# Patient Record
Sex: Female | Born: 1980 | Race: White | Marital: Single | Smoking: Never smoker
Health system: Southern US, Community
[De-identification: ages and names within clinical notes are randomized; demographics above are authoritative.]

## PROBLEM LIST (undated history)

## (undated) DIAGNOSIS — F329 Major depressive disorder, single episode, unspecified: Secondary | ICD-10-CM

## (undated) DIAGNOSIS — F32A Depression, unspecified: Secondary | ICD-10-CM

## (undated) DIAGNOSIS — J45909 Unspecified asthma, uncomplicated: Secondary | ICD-10-CM

## (undated) DIAGNOSIS — F419 Anxiety disorder, unspecified: Secondary | ICD-10-CM

## (undated) DIAGNOSIS — I10 Essential (primary) hypertension: Secondary | ICD-10-CM

## (undated) HISTORY — DX: Major depressive disorder, single episode, unspecified: F32.9

## (undated) HISTORY — DX: Unspecified asthma, uncomplicated: J45.909

## (undated) HISTORY — DX: Essential (primary) hypertension: I10

## (undated) HISTORY — DX: Anxiety disorder, unspecified: F41.9

## (undated) HISTORY — PX: CHOLECYSTECTOMY: SHX55

## (undated) HISTORY — DX: Depression, unspecified: F32.A

---

## 2017-08-24 ENCOUNTER — Other Ambulatory Visit: Payer: Self-pay | Admitting: Orthopedic Surgery

## 2017-08-24 DIAGNOSIS — M25522 Pain in left elbow: Secondary | ICD-10-CM

## 2017-09-06 ENCOUNTER — Ambulatory Visit
Admission: RE | Admit: 2017-09-06 | Discharge: 2017-09-06 | Disposition: A | Discharge status: Home / Self Care | Payer: 59 | Source: Ambulatory Visit | Attending: Orthopedic Surgery | Admitting: Orthopedic Surgery

## 2017-09-06 DIAGNOSIS — M25522 Pain in left elbow: Secondary | ICD-10-CM

## 2017-10-27 ENCOUNTER — Encounter: Payer: Self-pay | Admitting: Neurology

## 2017-10-27 ENCOUNTER — Ambulatory Visit: Payer: 59 | Admitting: Neurology

## 2017-10-27 VITALS — BP 148/90 | HR 90 | Ht 60.0 in | Wt 346.0 lb

## 2017-10-27 DIAGNOSIS — M25522 Pain in left elbow: Secondary | ICD-10-CM

## 2017-10-27 DIAGNOSIS — Z6841 Body Mass Index (BMI) 40.0 and over, adult: Secondary | ICD-10-CM

## 2017-10-27 DIAGNOSIS — R0683 Snoring: Secondary | ICD-10-CM | POA: Diagnosis not present

## 2017-10-27 DIAGNOSIS — R519 Headache, unspecified: Secondary | ICD-10-CM

## 2017-10-27 DIAGNOSIS — R400 Somnolence: Secondary | ICD-10-CM | POA: Diagnosis not present

## 2017-10-27 DIAGNOSIS — J4520 Mild intermittent asthma, uncomplicated: Secondary | ICD-10-CM

## 2017-10-27 DIAGNOSIS — R51 Headache: Secondary | ICD-10-CM | POA: Diagnosis not present

## 2017-10-27 NOTE — Progress Notes (Signed)
Subjective:    Patient ID: Rebecca Hays is a 37 y.o. female.  HPI     Rebecca Foley, MD, PhD [facility] [address], [address] [address] [place], [place] [address]  Dear Dr. Luiz Blare,   I saw your patient, Rebecca Hays, upon your kind request in my neurologic clinic today for initial consultation of her left elbow pain. The patient is unaccompanied today. As you know, Ms. Iseminger is a 37 year old ambidextrous (mostly left) handed woman with an underlying medical history of anxiety, depression, asthma, thyroid disease, hypertension and morbid obesity with a BMI of over 60, who reports a several year history of left elbow pain. She has had workup and intervention through your office. She had physical therap through your office. She had an MRI of her left elbow on 09/06/2017 and I reviewed the results: IMPRESSION: Unremarkable MR examination of the left elbow. Prior elbow x-ray did not show any significant abnormality. Your office note. She has been on symptomatically treatment with anti-inflammatory medication including Mobic. She was referred to physical therapy for tendinitis. She reports no progressive pain. She has no significant weakness or radiating pain. She has no lower extremity symptoms. Of note, she does endorse severe daytime somnolence. She has lack of energy. She would like to feel better in that regard. She has morning headaches. She did have depression after she lost her father some 2 years ago from liver cancer. She snores. Her Epworth sleepiness score is 24 out of 24 today, fatigue scores 48 out of 63. She is single and lives with her mother. She has no children. She is a nonsmoker and drinks alcohol rarely, drinks caffeine daily, 2 servings of coffee per day on average. She works as a Water quality scientist. Her left elbow pain fluctuates, currently feels stable. She does take daily my take 15 mg as per your prescription but reports that it does not help her elbow. It  helps her other pain. She reports that she has low back pain. She has leg cramps and toe cramps. She sees an endocrinologist for her thyroid disease. Her last blood work was in May 2017.  Her Past Medical History Is Significant For: Past Medical History:  Diagnosis Date  . Anxiety and depression   . Asthma   . Hypertension     Her Past Surgical History Is Significant For:  Her Family History Is Significant For: No family history on file.  Her Social History Is Significant For: Social History   Socioeconomic History  . Marital status: Single    Spouse name: None  . Number of children: None  . Years of education: None  . Highest education level: None  Social Needs  . Financial resource strain: None  . Food insecurity - worry: None  . Food insecurity - inability: None  . Transportation needs - medical: None  . Transportation needs - non-medical: None  Occupational History  . None  Tobacco Use  . Smoking status: Never Smoker  . Smokeless tobacco: Never Used  Substance and Sexual Activity  . Alcohol use: Yes  . Drug use: None  . Sexual activity: None  Other Topics Concern  . None  Social History Narrative  . None    Her Allergies Are:  Allergies  Allergen Reactions  . Biaxin [Clarithromycin]   . Codeine   . Pineapple   . Sulfa Antibiotics   :   Her Current Medications Are:  Outpatient Encounter Medications as of 10/27/2017  Medication Sig  . AmLODIPine  Besylate (NORVASC PO) Take by mouth.  . Escitalopram Oxalate (LEXAPRO PO) Take by mouth.  Marland Kitchen LISINOPRIL PO Take by mouth.  . meloxicam (MOBIC) 15 MG tablet Take 15 mg by mouth daily.  . Montelukast Sodium (SINGULAIR PO) Take by mouth.  . [DISCONTINUED] amlodipine-atorvastatin (CADUET) 10-10 MG tablet Take 1 tablet by mouth daily.   No facility-administered encounter medications on file as of 10/27/2017.    Review of Systems:  Out of a complete 14 point review of systems, all are reviewed and negative with the  exception of these symptoms as listed below:  Review of Systems  Neurological:       Pt presents today to discuss her left elbow pain. Left elbow hurts all the time. Pt is also complaining of muscle spasms. Pt does endorse snoring but has never had a sleep study.  Epworth Sleepiness Scale 0= would never doze 1= slight chance of dozing 2= moderate chance of dozing 3= high chance of dozing  Sitting and reading: 3 Watching TV: 3 Sitting inactive in a public place (ex. Theater or meeting): 3 As a passenger in a car for an hour without a break: 3 Lying down to rest in the afternoon: 3 Sitting and talking to someone: 3 Sitting quietly after lunch (no alcohol): 3 In a car, while stopped in traffic: 3 Total: 24     Objective:  Neurological Exam  Physical Exam Physical Examination:   Vitals:   10/27/17 1036  BP: (!) 148/90  Pulse: 90   General Examination: The patient is a very pleasant 37 y.o. female in no acute distress. She appears well-developed and well-nourished and well groomed.   HEENT: Normocephalic, atraumatic, pupils are equal, round and reactive to light and accommodation. Funduscopic exam is normal with shar disc margins noted. Extraocular tracking is good without limitation to gaze excursion or nystagmus noted. Normal smooth pursuit is noted. Hearing is grossly intact. Tympanic membranes are clear bilaterally. Face is symmetric with normal facial animation and normal facial sensation. Speech is clear with no dysarthria noted. There is no hypophonia. There is no lip, neck/head, jaw or voice tremor. Neck is supple with full range of passive and active motion. There are no carotid bruits on auscultation. Oropharynx exam reveals: mild mouth dryness, adequate dental hygiene and smaller airway entry, tonsils are absent. She has a small/rudimentary uvula, Mallampati is class I. Tongue protrudes centrally and palate elevates symmetrically. Neck circumference is 17-1/2 inches.    Chest: Clear to auscultation without wheezing, rhonchi or crackles noted.  Heart: S1+S2+0, regular and normal without murmurs, rubs or gallops noted.   Abdomen: Soft, non-tender and non-distended with normal bowel sounds appreciated on auscultation.  Extremities: There is no pitting edema in the distal lower extremities bilaterally. Pedal pulses are intact.  Skin: Warm and dry without trophic changes noted.  Musculoskeletal: exam reveals no obvious joint deformities, tenderness or joint swelling or erythema.   Neurologically:  Mental status: The patient is awake, alert and oriented in all 4 spheres. Her immediate and remote memory, attention, language skills and fund of knowledge are appropriate. There is no evidence of aphasia, agnosia, apraxia or anomia. Speech is clear with normal prosody and enunciation. Thought process is linear. Mood is normal and affect is normal.  Cranial nerves II - XII are as described above under HEENT exam. In addition: shoulder shrug is normal with equal shoulder height noted. Motor exam: Normal bulk, strength and tone is noted, with the exception of limitation in her elbow movements  on the left, secondary to pain reported. There is no drift, tremor or rebound. Romberg is negative. Reflexes are1+ throughout. Babinski: Toes are flexor bilaterally. Fine motor skills and coordination: intact with normal finger taps, normal hand movements, normal rapid alternating patting, normal foot taps and normal foot agility.  Cerebellar testing: No dysmetria or intention tremor on finger to nose testing. Heel to shin is unremarkable bilaterally. There is no truncal or gait ataxia.  Sensory exam: intact to light touch in the upper and lower extremities.  Gait, station and balance: She stands easily. No veering to one side is noted. No leaning to one side is noted. Posture is age-appropriate and stance is narrow based. Gait shows normal stride length and normal pace. No problems  turning are noted. Tandem walk is difficult for her, likely due to body habitus.  Assessment and Plan:    In summary, Ines Bloomermily Zucco is a very pleasant 37 y.o.-year old female with an underlying medical history of anxiety, depression, asthma, thyroid disease, hypertension and morbid obesity with a BMI of over 60, who presents for neurologic consultation of her left elbow pain. Her neurological exam is nonfocal with the exception of pain limitation in her left elbow, no visible atrophy, fasciculations, symmetrical reflexes noted. She has good fine motor skills bilaterally. I am not able to explain her left elbow pain. Nevertheless, we can certainly proceed with EMG and nerve conduction testing of the left upper extremity. She is cautioned regarding taking Mobic higher dose on a day-to-day basis especially on it is actually not helping her left elbow pain. Unrelated to her left elbow issues she also endorses morning headaches, significant daytime somnolence and snoring. In conjunction with her morning headaches, her history and exam especially in light of her morbid obesity are concerning for underlying obstructive sleep apnea. She is advised to consider sleep study testing. She would be willing to proceed with this and consider treatment for sleep apnea with a CPAP machine. To that end, I will order EMG and nerve conductions testing of her left upper extremity and sleep study. We will also do blood work as she endorses leg cramps. We will call her with her blood test results and I will see her back after her studies are completed. I answered all her questions today and the patient was in agreement. Thank you very much for allowing me to participate in the care of this nice patient. If I can be of any further assistance to you please do not hesitate to call me at [phone].  Sincerely,   Rebecca FoleySaima Oracio Galen, MD, PhD

## 2017-10-27 NOTE — Patient Instructions (Addendum)
We will check blood work today and call you with the test results.  I am not sure, how to explain your left elbow pain. We can certainly do a EMG and nerve conduction velocity test, which is an electrical nerve and muscle test, which we will schedule. We will call you with the results.  Unrelated to your elbow issue, you also endorse signs and symptoms concerning for obstructive sleep apnea or OSA, and I think we should proceed with a sleep study to determine whether you do or do not have OSA and how severe it is. If you have more than mild OSA, I want you to consider treatment with CPAP. Please remember, the risks and ramifications of moderate to severe obstructive sleep apnea or OSA are: Cardiovascular disease, including congestive heart failure, stroke, difficult to control hypertension, arrhythmias, and even type 2 diabetes has been linked to untreated OSA. Sleep apnea causes disruption of sleep and sleep deprivation in most cases, which, in turn, can cause recurrent headaches, problems with memory, mood, concentration, focus, and vigilance. Most people with untreated sleep apnea report excessive daytime sleepiness, which can affect their ability to drive. Please do not drive if you feel sleepy.   I will likely see you back after your sleep study to go over the test results and where to go from there. We will call you after your sleep study to advise about the results (most likely, you will hear from PeckKristen, my nurse) and to set up an appointment at the time, as necessary.    Our sleep lab administrative assistant will call you to schedule your sleep study. If you don't hear back from her by about 2 weeks from now, please feel free to call her at [phone]. You can leave a message with your phone number and concerns, if you get the voicemail box. She will call back as soon as possible.

## 2017-10-28 LAB — ENA+DNA/DS+SJORGEN'S
DSDNA AB: 2 [IU]/mL (ref 0–9)
ENA RNP AB: 2.3 AI — AB (ref 0.0–0.9)
ENA SSA (RO) Ab: <0.2 AI (ref 0.0–0.9)
ENA SSB (LA) Ab: <0.2 AI (ref 0.0–0.9)

## 2017-10-28 LAB — HGB A1C W/O EAG: HEMOGLOBIN A1C: 5.7 % — AB (ref 4.8–5.6)

## 2017-10-28 LAB — COMPREHENSIVE METABOLIC PANEL
A/G RATIO: 1.8 (ref 1.2–2.2)
ALT: 51 IU/L — AB (ref 0–32)
AST: 37 IU/L (ref 0–40)
Albumin: 4.4 g/dL (ref 3.5–5.5)
Alkaline Phosphatase: 67 IU/L (ref 39–117)
BILIRUBIN TOTAL: 0.4 mg/dL (ref 0.0–1.2)
BUN/Creatinine Ratio: 16 (ref 9–23)
BUN: 9 mg/dL (ref 6–20)
CHLORIDE: 100 mmol/L (ref 96–106)
CO2: 24 mmol/L (ref 20–29)
Calcium: 9.4 mg/dL (ref 8.7–10.2)
Creatinine, Ser: 0.56 mg/dL — ABNORMAL LOW (ref 0.57–1.00)
GFR calc non Af Amer: 119 mL/min/{1.73_m2} (ref >59)
GFR, EST AFRICAN AMERICAN: 137 mL/min/{1.73_m2} (ref >59)
GLUCOSE: 90 mg/dL (ref 65–99)
Globulin, Total: 2.5 g/dL (ref 1.5–4.5)
POTASSIUM: 4.5 mmol/L (ref 3.5–5.2)
Sodium: 141 mmol/L (ref 134–144)
TOTAL PROTEIN: 6.9 g/dL (ref 6.0–8.5)

## 2017-10-28 LAB — VITAMIN D 25 HYDROXY (VIT D DEFICIENCY, FRACTURES): VIT D 25 HYDROXY: 23.3 ng/mL — AB (ref 30.0–100.0)

## 2017-10-28 LAB — B12 AND FOLATE PANEL
Folate: 5.8 ng/mL (ref >3.0)
VITAMIN B 12: 587 pg/mL (ref 232–1245)

## 2017-10-28 LAB — ANA W/REFLEX: ANA: POSITIVE — AB

## 2017-10-28 LAB — CK: CK TOTAL: 154 U/L (ref 24–173)

## 2017-10-28 LAB — TSH: TSH: 1.22 u[IU]/mL (ref 0.450–4.500)

## 2017-10-31 ENCOUNTER — Telehealth: Payer: Self-pay

## 2017-10-31 NOTE — Telephone Encounter (Signed)
I called pt to discuss labs. No answer, VM full, will try again later.

## 2017-10-31 NOTE — Progress Notes (Signed)
Labs show: A1c in the borderline pre-diabetic range, weight loss and limiting sugar and carb intake will help.  Vit D level low, and autoimmune labs are showing possible underlying rheumatological issue: Pls ask pt to make a FU appt with PCP for: low vit. D and to discuss need to see rheumatologist. He can make a referral locally. She may benefit from Rx strength vit D vs. OTC, as per PCP recommendation.  Huston FoleySaima Yonah Tangeman, MD, PhD [facility])

## 2017-10-31 NOTE — Telephone Encounter (Signed)
-----   Message from Huston FoleySaima Athar, MD sent at 10/31/2017  7:50 AM EST ----- Labs show: A1c in the borderline pre-diabetic range, weight loss and limiting sugar and carb intake will help.  Vit D level low, and autoimmune labs are showing possible underlying rheumatological issue: Pls ask pt to make a FU appt with PCP for: low vit. D and to discuss need to see rheumatologist. He can make a referral locally. She may benefit from Rx strength vit D vs. OTC, as per PCP recommendation.  Huston FoleySaima Athar, MD, PhD [facility])

## 2017-11-01 NOTE — Telephone Encounter (Signed)
I called pt. I advised her that her lab work revealed that her HgBA1C was in the borderline pre-diabetic range, which weight loss and a reduction in her carbs should help. I advised her that her vitamin D level is low and that her auto-immune labs are showing a possible underlying rheumatological issue and Dr. Frances FurbishAthar recommends a follow up with her PCP to discuss the low vitamin D and the need for a rheumatologist, Pt will follow up with her PCP regarding these labs and asked that they be faxed to Dr. Delford Fieldash. Pt verbalized understanding of results. Pt had no questions at this time but was encouraged to call back if questions arise.

## 2017-11-01 NOTE — Telephone Encounter (Signed)
I called pt again to discuss. No answer, VM is full, will try again later. 

## 2017-11-01 NOTE — Telephone Encounter (Signed)
Pt returning call is aware that RN will call back when available

## 2017-11-08 ENCOUNTER — Encounter: Payer: 59 | Admitting: Neurology

## 2017-11-08 ENCOUNTER — Telehealth: Payer: Self-pay | Admitting: *Deleted

## 2017-11-08 NOTE — Telephone Encounter (Signed)
Pt canceled NCV/EMG same day due to transportation issues.

## 2017-11-14 ENCOUNTER — Encounter: Payer: Self-pay | Admitting: Neurology

## 2017-11-22 ENCOUNTER — Encounter: Payer: 59 | Admitting: Neurology

## 2017-11-30 ENCOUNTER — Telehealth: Payer: Self-pay | Admitting: Neurology

## 2017-11-30 NOTE — Telephone Encounter (Signed)
We have attempted to call the patient two times to schedule sleep study. Patient has been unavailable at the phone numbers we have on file, and has not returned our calls. At this time, we will send a letter asking patient to please contact the sleep lab.  °

## 2017-12-01 ENCOUNTER — Ambulatory Visit: Payer: 59 | Admitting: Neurology

## 2018-05-24 IMAGING — MR MR ELBOW*L* W/O CM
4 series · 40 of 40 positions shown · non-contrast
Comparison: None.

CLINICAL DATA: Left elbow pain. Injured at work 5 years ago.
Persistent pain and weakness.

EXAM:
MRI OF THE LEFT ELBOW WITHOUT CONTRAST
TECHNIQUE: Multiplanar, multisequence MR imaging of the elbow was performed. No
intravenous contrast was administered.

[Series 4: T2 fat-sat · axial · left · 3.0mm · 0.47mm/px · z∈[+21,+94]mm · 10 of 23 slices shown (1 of 2)]
[im 1/23]
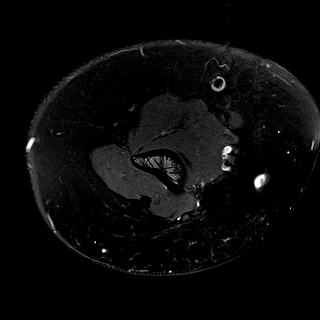
[im 3/23]
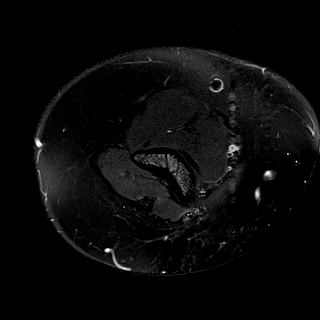
[im 5/23]
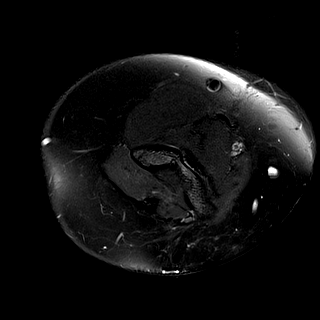
[im 8/23]
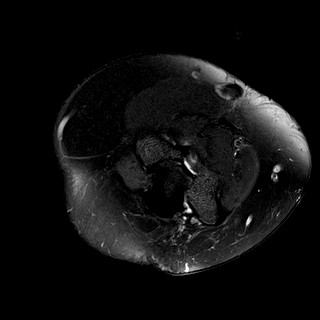
[im 10/23]
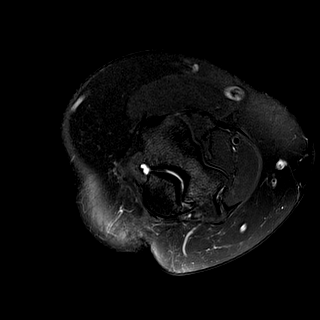
[im 13/23]
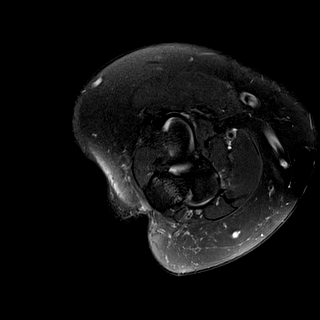
[im 15/23]
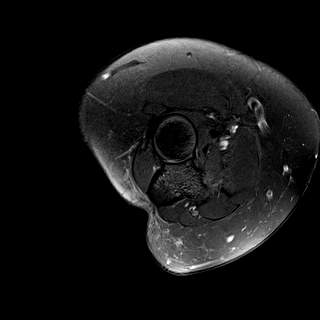
[im 18/23]
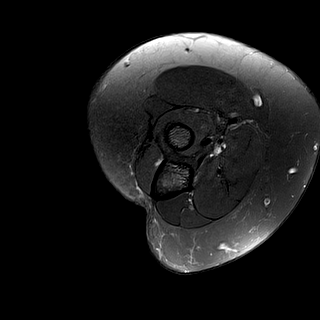
[im 20/23]
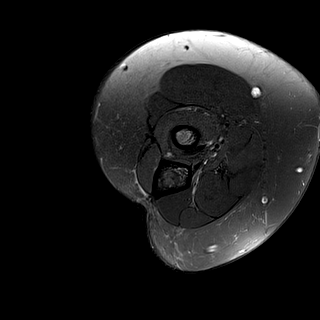
[im 23/23]
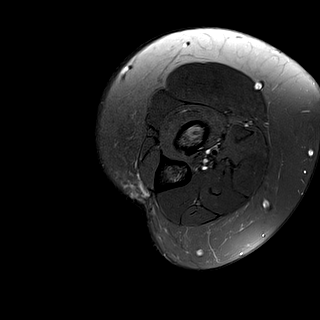

[Series 5: T1 · axial · left · 3.0mm · 0.47mm/px · z∈[+21,+94]mm · 10 of 23 slices shown]
[im 1/23]
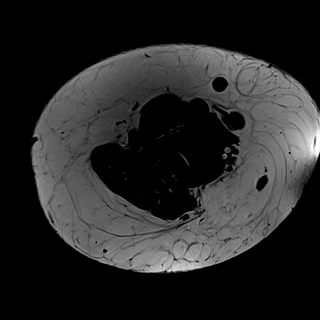
[im 3/23]
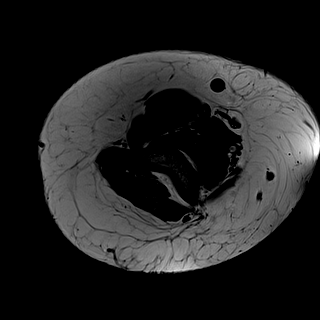
[im 5/23]
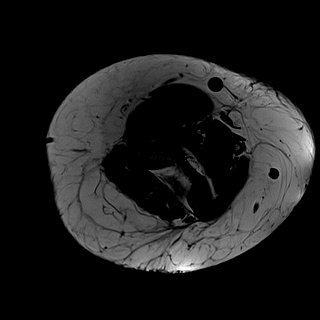
[im 8/23]
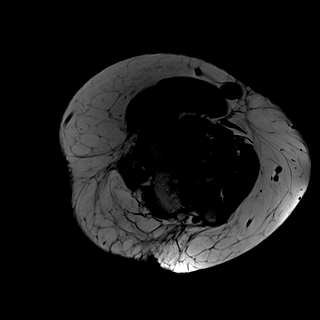
[im 10/23]
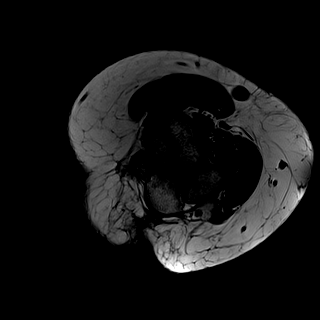
[im 13/23]
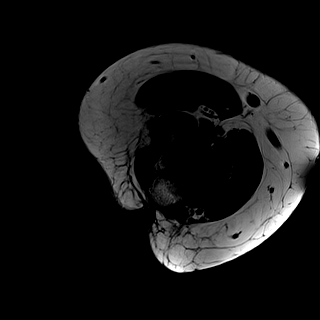
[im 15/23]
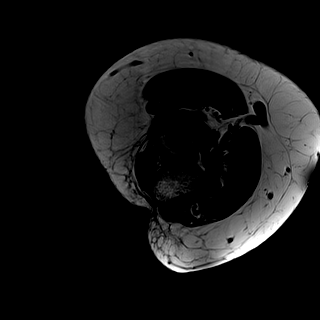
[im 18/23]
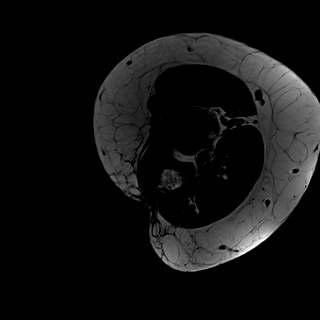
[im 20/23]
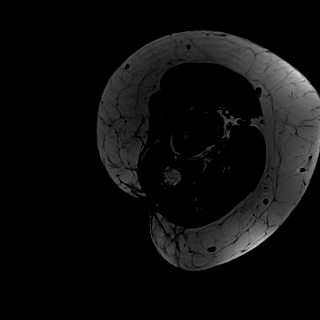
[im 23/23]
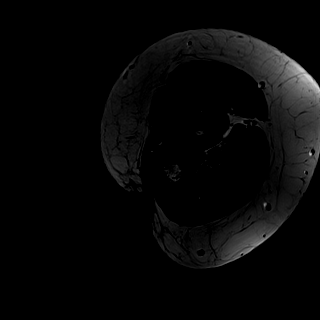

[Series 6: T2 fat-sat · oblique · left · 3.0mm · 0.44mm/px · 10 of 24 slices shown (2 of 2)]
[im 1/24]
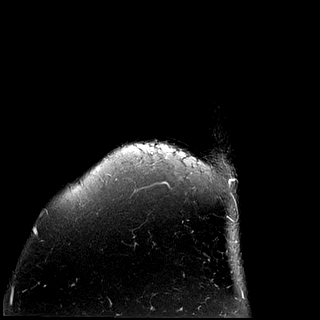
[im 3/24]
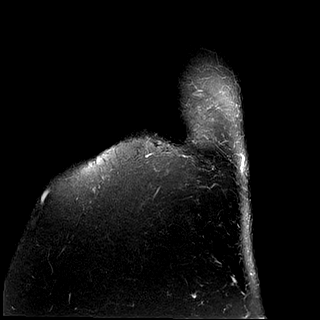
[im 6/24]
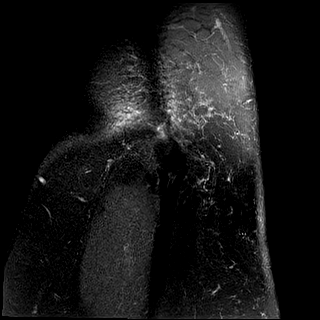
[im 8/24]
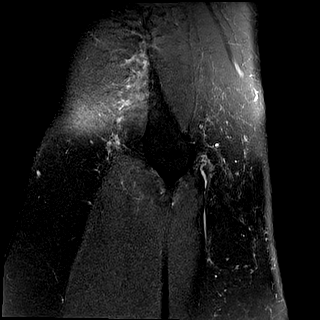
[im 11/24]
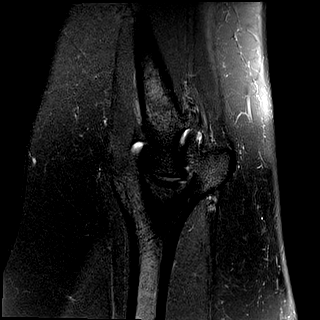
[im 13/24]
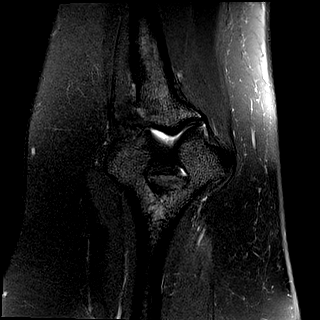
[im 16/24]
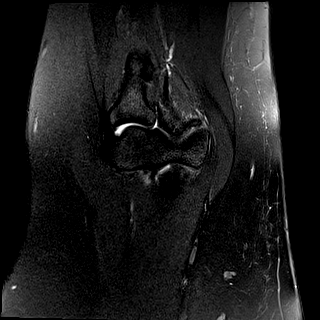
[im 18/24]
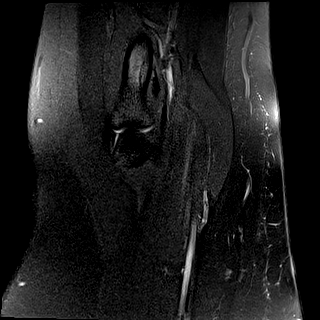
[im 21/24]
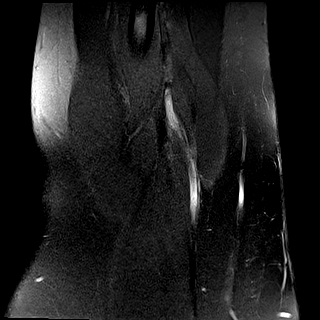
[im 24/24]
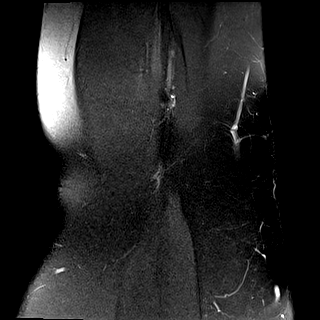

[Series 7: PD fat-sat · oblique · left · 3.0mm · 0.36mm/px · 10 of 24 slices shown]
[im 1/24]
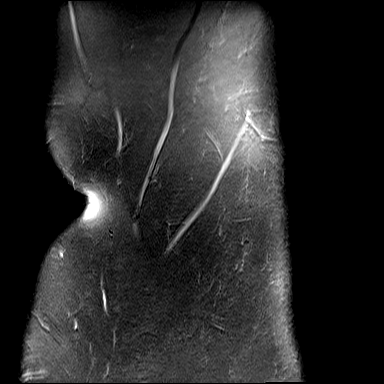
[im 3/24]
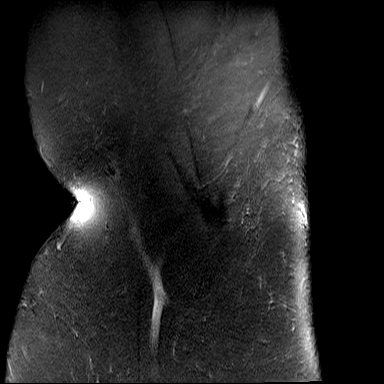
[im 6/24]
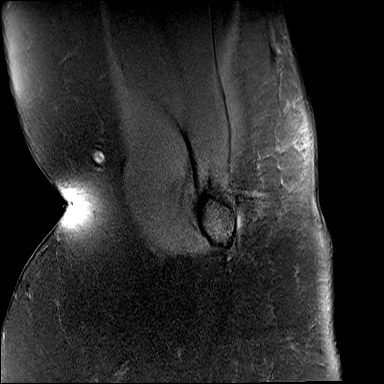
[im 8/24]
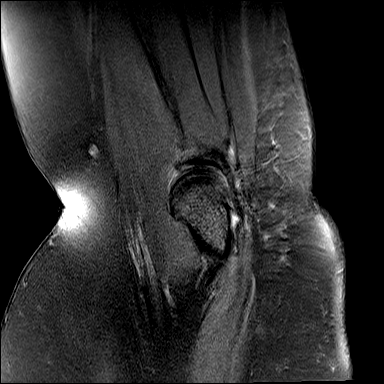
[im 11/24]
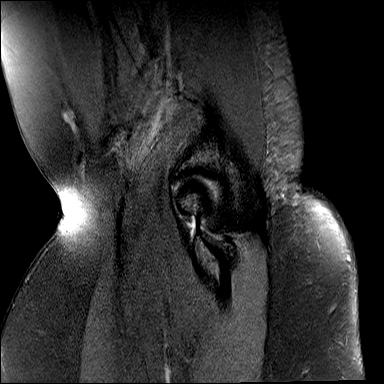
[im 13/24]
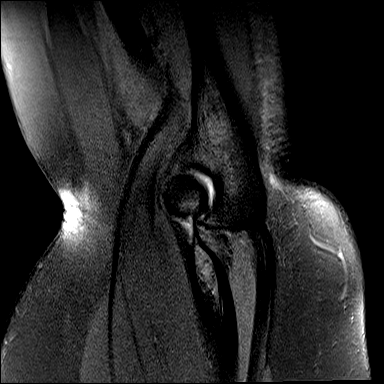
[im 16/24]
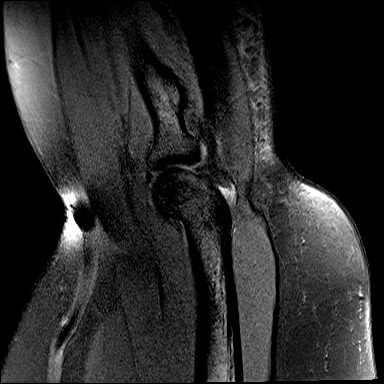
[im 18/24]
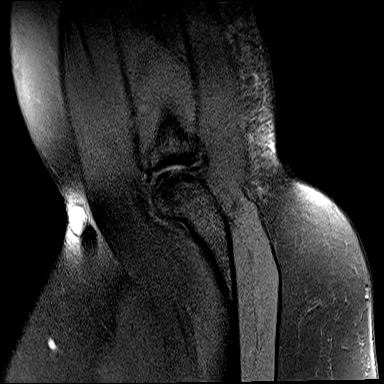
[im 21/24]
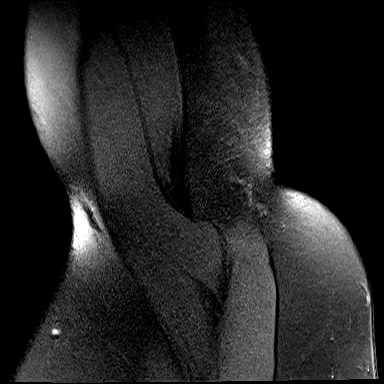
[im 24/24]
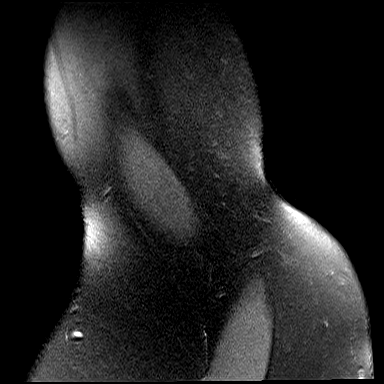

[40 of 40 positions shown; findings below may reference images not displayed]

FINDINGS: TENDONS

Common forearm flexor origin: Intact. No significant
tendinopathy/tendinosis.

Common forearm extensor origin: Intact. No significant tendinopathy/
tendinosis.

Biceps: Intact.  The brachialis tendon is also intact.

Triceps: Intact

LIGAMENTS

Medial stabilizers: Intact

Lateral stabilizers:  Intact

Cartilage: No cartilage defects or osteochondral lesions.

Joint: No joint effusion or obvious loose bodies. No significant
degenerative changes or erosive findings.

Cubital tunnel: Normal

Bones: No significant bony findings. No marrow edema or
osteochondral abnormality.
IMPRESSION: Unremarkable MR examination of the left elbow.
